# Patient Record
Sex: Male | Born: 1965 | Race: White | Hispanic: No
Health system: Southern US, Community
[De-identification: ages and names within clinical notes are randomized; demographics above are authoritative.]

## PROBLEM LIST (undated history)

## (undated) DIAGNOSIS — F4310 Post-traumatic stress disorder, unspecified: Secondary | ICD-10-CM

---

## 2008-07-15 DIAGNOSIS — I2510 Atherosclerotic heart disease of native coronary artery without angina pectoris: Secondary | ICD-10-CM

## 2008-07-15 HISTORY — PX: CORONARY ARTERY BYPASS GRAFT: SHX141

## 2008-07-15 HISTORY — DX: Atherosclerotic heart disease of native coronary artery without angina pectoris: I25.10

## 2019-06-07 ENCOUNTER — Other Ambulatory Visit: Payer: Self-pay

## 2019-06-07 ENCOUNTER — Inpatient Hospital Stay
Admission: EM | Admit: 2019-06-07 | Discharge: 2019-06-09 | DRG: 247 | Disposition: A | Discharge status: Home / Self Care | Payer: No Typology Code available for payment source | Attending: Internal Medicine | Admitting: Internal Medicine

## 2019-06-07 ENCOUNTER — Encounter
Admission: EM | Disposition: A | Discharge status: Home / Self Care | Payer: Self-pay | Source: Home / Self Care | Attending: Internal Medicine

## 2019-06-07 ENCOUNTER — Emergency Department: Payer: No Typology Code available for payment source

## 2019-06-07 ENCOUNTER — Encounter: Payer: Self-pay | Admitting: Cardiology

## 2019-06-07 DIAGNOSIS — Z888 Allergy status to other drugs, medicaments and biological substances status: Secondary | ICD-10-CM

## 2019-06-07 DIAGNOSIS — F4310 Post-traumatic stress disorder, unspecified: Secondary | ICD-10-CM | POA: Diagnosis present

## 2019-06-07 DIAGNOSIS — R079 Chest pain, unspecified: Secondary | ICD-10-CM

## 2019-06-07 DIAGNOSIS — I214 Non-ST elevation (NSTEMI) myocardial infarction: Secondary | ICD-10-CM | POA: Diagnosis present

## 2019-06-07 DIAGNOSIS — R0789 Other chest pain: Secondary | ICD-10-CM | POA: Diagnosis present

## 2019-06-07 DIAGNOSIS — I255 Ischemic cardiomyopathy: Secondary | ICD-10-CM | POA: Diagnosis present

## 2019-06-07 DIAGNOSIS — I959 Hypotension, unspecified: Secondary | ICD-10-CM | POA: Diagnosis not present

## 2019-06-07 DIAGNOSIS — Z951 Presence of aortocoronary bypass graft: Secondary | ICD-10-CM

## 2019-06-07 DIAGNOSIS — Z20828 Contact with and (suspected) exposure to other viral communicable diseases: Secondary | ICD-10-CM | POA: Diagnosis present

## 2019-06-07 DIAGNOSIS — R001 Bradycardia, unspecified: Secondary | ICD-10-CM | POA: Diagnosis not present

## 2019-06-07 DIAGNOSIS — Z955 Presence of coronary angioplasty implant and graft: Secondary | ICD-10-CM

## 2019-06-07 DIAGNOSIS — I2111 ST elevation (STEMI) myocardial infarction involving right coronary artery: Secondary | ICD-10-CM | POA: Diagnosis present

## 2019-06-07 DIAGNOSIS — I25720 Atherosclerosis of autologous artery coronary artery bypass graft(s) with unstable angina pectoris: Secondary | ICD-10-CM | POA: Insufficient documentation

## 2019-06-07 DIAGNOSIS — Z886 Allergy status to analgesic agent status: Secondary | ICD-10-CM | POA: Diagnosis not present

## 2019-06-07 DIAGNOSIS — I2510 Atherosclerotic heart disease of native coronary artery without angina pectoris: Secondary | ICD-10-CM | POA: Diagnosis present

## 2019-06-07 DIAGNOSIS — E785 Hyperlipidemia, unspecified: Secondary | ICD-10-CM | POA: Diagnosis present

## 2019-06-07 DIAGNOSIS — I200 Unstable angina: Secondary | ICD-10-CM

## 2019-06-07 HISTORY — PX: CORONARY/GRAFT ACUTE MI REVASCULARIZATION: CATH118305

## 2019-06-07 HISTORY — PX: LEFT HEART CATH AND CORONARY ANGIOGRAPHY: CATH118249

## 2019-06-07 HISTORY — DX: Post-traumatic stress disorder, unspecified: F43.10

## 2019-06-07 LAB — BASIC METABOLIC PANEL
Anion gap: 14 (ref 5–15)
BUN: 15 mg/dL (ref 6–20)
CO2: 24 mmol/L (ref 22–32)
Calcium: 9.8 mg/dL (ref 8.9–10.3)
Chloride: 99 mmol/L (ref 98–111)
Creatinine, Ser: 1 mg/dL (ref 0.61–1.24)
GFR calc Af Amer: >60 mL/min (ref >60)
GFR calc non Af Amer: >60 mL/min (ref >60)
Glucose, Bld: 153 mg/dL — ABNORMAL HIGH (ref 70–99)
Potassium: 3.6 mmol/L (ref 3.5–5.1)
Sodium: 137 mmol/L (ref 135–145)

## 2019-06-07 LAB — TROPONIN I (HIGH SENSITIVITY)
Troponin I (High Sensitivity): 18 ng/L — ABNORMAL HIGH (ref <18)
Troponin I (High Sensitivity): 402 ng/L (ref <18)
Troponin I (High Sensitivity): 12149 ng/L (ref <18)

## 2019-06-07 LAB — SARS CORONAVIRUS 2 BY RT PCR (HOSPITAL ORDER, PERFORMED IN ~~LOC~~ HOSPITAL LAB): SARS Coronavirus 2: NEGATIVE

## 2019-06-07 LAB — CBC
HCT: 51 % (ref 39.0–52.0)
Hemoglobin: 17.1 g/dL — ABNORMAL HIGH (ref 13.0–17.0)
MCH: 31.4 pg (ref 26.0–34.0)
MCHC: 33.5 g/dL (ref 30.0–36.0)
MCV: 93.6 fL (ref 80.0–100.0)
Platelets: 275 10*3/uL (ref 150–400)
RBC: 5.45 MIL/uL (ref 4.22–5.81)
RDW: 12.7 % (ref 11.5–15.5)
WBC: 13.8 10*3/uL — ABNORMAL HIGH (ref 4.0–10.5)
nRBC: 0 % (ref 0.0–0.2)

## 2019-06-07 LAB — PROTIME-INR
INR: 1 (ref 0.8–1.2)
Prothrombin Time: 13.2 seconds (ref 11.4–15.2)

## 2019-06-07 LAB — MRSA PCR SCREENING: MRSA by PCR: NEGATIVE

## 2019-06-07 LAB — GLUCOSE, CAPILLARY: Glucose-Capillary: 106 mg/dL — ABNORMAL HIGH (ref 70–99)

## 2019-06-07 LAB — POCT ACTIVATED CLOTTING TIME
Activated Clotting Time: 147 seconds
Activated Clotting Time: 307 seconds
Activated Clotting Time: 323 seconds

## 2019-06-07 LAB — APTT: aPTT: 27 seconds (ref 24–36)

## 2019-06-07 SURGERY — CORONARY/GRAFT ACUTE MI REVASCULARIZATION
Anesthesia: Moderate Sedation

## 2019-06-07 MED ORDER — ATROPINE SULFATE 1 MG/10ML IJ SOSY
PREFILLED_SYRINGE | INTRAMUSCULAR | Status: AC
  Filled 2019-06-07: qty 10

## 2019-06-07 MED ORDER — NITROGLYCERIN 0.4 MG SL SUBL
0.4000 mg | SUBLINGUAL_TABLET | SUBLINGUAL | Status: DC | PRN
  Administered 2019-06-07 (×2): 0.4 mg via SUBLINGUAL
  Filled 2019-06-07 (×2): qty 1

## 2019-06-07 MED ORDER — SODIUM CHLORIDE 0.9% FLUSH: 3.0000 mL | INTRAVENOUS | Status: DC | PRN

## 2019-06-07 MED ORDER — HEPARIN SODIUM (PORCINE) 1000 UNIT/ML IJ SOLN
INTRAMUSCULAR | Status: AC
  Filled 2019-06-07: qty 1

## 2019-06-07 MED ORDER — SODIUM CHLORIDE 0.9% FLUSH
3.0000 mL | Freq: Two times a day (BID) | INTRAVENOUS | Status: DC
  Administered 2019-06-08 (×3): 3 mL via INTRAVENOUS

## 2019-06-07 MED ORDER — FENTANYL CITRATE (PF) 100 MCG/2ML IJ SOLN
INTRAMUSCULAR | Status: AC
  Filled 2019-06-07: qty 2

## 2019-06-07 MED ORDER — SODIUM CHLORIDE 0.9% FLUSH: 3.0000 mL | Freq: Once | INTRAVENOUS | Status: DC

## 2019-06-07 MED ORDER — SODIUM CHLORIDE 0.9 % IV SOLN: 250.0000 mL | INTRAVENOUS | Status: DC | PRN

## 2019-06-07 MED ORDER — ATROPINE SULFATE 1 MG/10ML IJ SOSY
PREFILLED_SYRINGE | INTRAMUSCULAR | Status: DC | PRN
  Administered 2019-06-07 (×2): 0.5 mg via INTRAVENOUS

## 2019-06-07 MED ORDER — MORPHINE SULFATE (PF) 4 MG/ML IV SOLN
4.0000 mg | Freq: Once | INTRAVENOUS | Status: AC
  Administered 2019-06-07: 4 mg via INTRAVENOUS
  Filled 2019-06-07: qty 1

## 2019-06-07 MED ORDER — ENOXAPARIN SODIUM 40 MG/0.4ML ~~LOC~~ SOLN: 40.0000 mg | SUBCUTANEOUS | Status: DC

## 2019-06-07 MED ORDER — SODIUM CHLORIDE 0.9 % WEIGHT BASED INFUSION
1.0000 mL/kg/h | INTRAVENOUS | Status: AC
  Administered 2019-06-07 (×2): 1 mL/kg/h via INTRAVENOUS

## 2019-06-07 MED ORDER — HEPARIN (PORCINE) IN NACL 1000-0.9 UT/500ML-% IV SOLN
INTRAVENOUS | Status: AC
  Filled 2019-06-07: qty 1000

## 2019-06-07 MED ORDER — HEPARIN (PORCINE) 25000 UT/250ML-% IV SOLN
1000.0000 [IU]/h | INTRAVENOUS | Status: DC
  Administered 2019-06-07: 1000 [IU]/h via INTRAVENOUS
  Filled 2019-06-07: qty 250

## 2019-06-07 MED ORDER — TIROFIBAN (AGGRASTAT) BOLUS VIA INFUSION
INTRAVENOUS | Status: DC | PRN
  Administered 2019-06-07: 2155 ug via INTRAVENOUS

## 2019-06-07 MED ORDER — MIDAZOLAM HCL 2 MG/2ML IJ SOLN
INTRAMUSCULAR | Status: AC
  Filled 2019-06-07: qty 2

## 2019-06-07 MED ORDER — TIROFIBAN HCL IV 12.5 MG/250 ML
INTRAVENOUS | Status: AC | PRN
  Administered 2019-06-07: 0.15 ug/kg/min via INTRAVENOUS

## 2019-06-07 MED ORDER — CARVEDILOL 3.125 MG PO TABS
3.1250 mg | ORAL_TABLET | Freq: Two times a day (BID) | ORAL | Status: DC
  Administered 2019-06-08 – 2019-06-09 (×3): 3.125 mg via ORAL
  Filled 2019-06-07 (×4): qty 1

## 2019-06-07 MED ORDER — DIAZEPAM 5 MG PO TABS
5.0000 mg | ORAL_TABLET | Freq: Once | ORAL | Status: AC
  Administered 2019-06-07: 5 mg via ORAL
  Filled 2019-06-07: qty 1

## 2019-06-07 MED ORDER — TIROFIBAN HCL IN NACL 5-0.9 MG/100ML-% IV SOLN
0.1500 ug/kg/min | INTRAVENOUS | Status: AC
  Administered 2019-06-07 – 2019-06-08 (×3): 0.15 ug/kg/min via INTRAVENOUS
  Filled 2019-06-07 (×5): qty 100

## 2019-06-07 MED ORDER — TICAGRELOR 90 MG PO TABS
90.0000 mg | ORAL_TABLET | Freq: Two times a day (BID) | ORAL | Status: DC
  Administered 2019-06-07 – 2019-06-08 (×2): 90 mg via ORAL
  Filled 2019-06-07 (×2): qty 1

## 2019-06-07 MED ORDER — MIDAZOLAM HCL 2 MG/2ML IJ SOLN
INTRAMUSCULAR | Status: DC | PRN
  Administered 2019-06-07 (×2): 1 mg via INTRAVENOUS

## 2019-06-07 MED ORDER — SODIUM CHLORIDE 0.9 % IV BOLUS
1000.0000 mL | Freq: Once | INTRAVENOUS | Status: AC
  Administered 2019-06-07: 1000 mL via INTRAVENOUS

## 2019-06-07 MED ORDER — TICAGRELOR 90 MG PO TABS
ORAL_TABLET | ORAL | Status: DC | PRN
  Administered 2019-06-07: 180 mg via ORAL

## 2019-06-07 MED ORDER — MORPHINE SULFATE (PF) 4 MG/ML IV SOLN
4.0000 mg | Freq: Once | INTRAVENOUS | Status: AC
  Administered 2019-06-07: 4 mg via INTRAVENOUS

## 2019-06-07 MED ORDER — SODIUM CHLORIDE 0.9 % IV SOLN
INTRAVENOUS | Status: DC | PRN
  Administered 2019-06-07 (×2): 25 mL/h via INTRAVENOUS

## 2019-06-07 MED ORDER — HEPARIN BOLUS VIA INFUSION
4000.0000 [IU] | Freq: Once | INTRAVENOUS | Status: AC
  Administered 2019-06-07: 4000 [IU] via INTRAVENOUS
  Filled 2019-06-07: qty 4000

## 2019-06-07 MED ORDER — HEPARIN SODIUM (PORCINE) 1000 UNIT/ML IJ SOLN
INTRAMUSCULAR | Status: DC | PRN
  Administered 2019-06-07 (×2): 8000 [IU] via INTRAVENOUS

## 2019-06-07 MED ORDER — MORPHINE SULFATE (PF) 4 MG/ML IV SOLN
INTRAVENOUS | Status: AC
  Filled 2019-06-07: qty 1

## 2019-06-07 MED ORDER — DIAZEPAM 2 MG PO TABS: 2.0000 mg | ORAL_TABLET | Freq: Four times a day (QID) | ORAL | Status: DC | PRN

## 2019-06-07 MED ORDER — ACETAMINOPHEN 325 MG PO TABS: 650.0000 mg | ORAL_TABLET | ORAL | Status: DC | PRN

## 2019-06-07 MED ORDER — IOHEXOL 300 MG/ML  SOLN
INTRAMUSCULAR | Status: DC | PRN
  Administered 2019-06-07: 230 mL

## 2019-06-07 MED ORDER — TICAGRELOR 90 MG PO TABS
ORAL_TABLET | ORAL | Status: AC
  Filled 2019-06-07: qty 2

## 2019-06-07 MED ORDER — FENTANYL CITRATE (PF) 100 MCG/2ML IJ SOLN
INTRAMUSCULAR | Status: DC | PRN
  Administered 2019-06-07: 25 ug via INTRAVENOUS
  Administered 2019-06-07: 50 ug via INTRAVENOUS

## 2019-06-07 MED ORDER — ONDANSETRON HCL 4 MG/2ML IJ SOLN
4.0000 mg | Freq: Four times a day (QID) | INTRAMUSCULAR | Status: DC | PRN
  Administered 2019-06-07: 4 mg via INTRAVENOUS
  Filled 2019-06-07: qty 2

## 2019-06-07 SURGICAL SUPPLY — 26 items
BALLN TREK RX 2.5X20 (BALLOONS) ×2
BALLN ~~LOC~~ TREK RX 3.75X12 (BALLOONS) ×2
BALLOON TREK RX 2.5X20 (BALLOONS) IMPLANT
BALLOON ~~LOC~~ TREK RX 3.75X12 (BALLOONS) IMPLANT
CANNULA 5F STIFF (CANNULA) ×1 IMPLANT
CATH EXTRAC PRONTO 5.5F 138CM (CATHETERS) ×1 IMPLANT
CATH INFINITI 5 FR IM (CATHETERS) ×1 IMPLANT
CATH INFINITI 5FR ANG PIGTAIL (CATHETERS) ×1 IMPLANT
CATH INFINITI 5FR JL4 (CATHETERS) ×1 IMPLANT
CATH INFINITI JR4 5F (CATHETERS) ×1 IMPLANT
CATH LAUNCHER 6FR JR4 (CATHETERS) ×1 IMPLANT
DEVICE CLOSURE MYNXGRIP 6/7F (Vascular Products) ×1 IMPLANT
DEVICE INFLAT 30 PLUS (MISCELLANEOUS) ×1 IMPLANT
GLIDESHEATH SLEND SS 6F .021 (SHEATH) IMPLANT
KIT MANI 3VAL PERCEP (MISCELLANEOUS) ×2 IMPLANT
NDL PERC 18GX7CM (NEEDLE) IMPLANT
NEEDLE PERC 18GX7CM (NEEDLE) ×2 IMPLANT
PACK CARDIAC CATH (CUSTOM PROCEDURE TRAY) ×2 IMPLANT
SHEATH AVANTI 6FR X 11CM (SHEATH) ×1 IMPLANT
STENT RESOLUTE ONYX 3.0X22 (Permanent Stent) ×1 IMPLANT
STENT RESOLUTE ONYX 3.5X30 (Permanent Stent) ×1 IMPLANT
VALVE COPILOT STAT (MISCELLANEOUS) ×1 IMPLANT
WIRE EMERALD 3MM-J .035X260CM (WIRE) ×1 IMPLANT
WIRE GUIDERIGHT .035X150 (WIRE) ×1 IMPLANT
WIRE ROSEN-J .035X260CM (WIRE) IMPLANT
WIRE RUNTHROUGH .014X180CM (WIRE) ×1 IMPLANT

## 2019-06-07 NOTE — ED Notes (Signed)
Cardiology at bedside.

## 2019-06-07 NOTE — ED Notes (Signed)
Pt given urinal.

## 2019-06-07 NOTE — ED Notes (Signed)
Waiting for cath lab to be ready from previous pt.

## 2019-06-07 NOTE — ED Notes (Signed)
[facility]. Wife in room. No needs at this time.

## 2019-06-07 NOTE — ED Notes (Signed)
Pt to cath lab with RN, zoll and MD

## 2019-06-07 NOTE — ED Triage Notes (Signed)
Patient to RM 25 via EMS from home for sudden onset of chest pain.

## 2019-06-07 NOTE — ED Notes (Signed)
Pt c/o worsening chest pain.  EKG was repeated and shown to dr Charna Archer along with update on sx.  New orders received.

## 2019-06-07 NOTE — ED Notes (Signed)
PAPERWORK  FAXED  TO  [facility] 

## 2019-06-07 NOTE — ED Notes (Signed)
defib pads placed on pt. Pt preparing for cath lab

## 2019-06-07 NOTE — Consult Note (Signed)
ANTICOAGULATION CONSULT NOTE - Initial Consult  Pharmacy Consult for Heparin Infusion  Indication: chest pain/ACS  Allergies  Allergen Reactions  . Aspirin Hives  . Plavix [Clopidogrel Bisulfate] Hives    "messes with my liver"    Patient Measurements: Height: 5\' 8"  (172.7 cm) Weight: 190 lb (86.2 kg) IBW/kg (Calculated) : 68.4 Heparin Dosing Weight: 85kg  Vital Signs: Temp: 99.1 F (37.3 C) (11/23 0428) Temp Source: Oral (11/23 0428) BP: 115/76 (11/23 0930) Pulse Rate: 44 (11/23 0930)  Labs: Recent Labs    06/07/19 0538 06/07/19 0750  HGB 17.1*  --   HCT 51.0  --   PLT 275  --   CREATININE 1.00  --   TROPONINIHS 18* 402*    Estimated Creatinine Clearance: 93.3 mL/min (by C-G formula based on SCr of 1 mg/dL).  Medical History: No past medical history on file.  Assessment: Pharmacy consulted for heparin infusion dosing and monitoring in 53 yo male for ACS/STEMI.  Troponin HS: 18>> 402   Goal of Therapy:  Heparin level 0.3-0.7 units/ml Monitor platelets by anticoagulation protocol: Yes   Plan:  Baseline labs ordered Give 4000 units bolus x 1 Start heparin infusion at 1000 units/hr Check anti-Xa level in 6 hours and daily while on heparin Continue to monitor H&H and platelets  Pernell Dupre, PharmD, BCPS Clinical Pharmacist 06/07/2019 9:59 AM

## 2019-06-07 NOTE — ED Provider Notes (Addendum)
[facility] Emergency Department Provider Note  ____________________________________________   I have reviewed the triage vital signs and the nursing notes.   HISTORY  Chief Complaint Chest Pain   History limited by: Not Limited   HPI Bradley Rosales is a 53 y.o. male who presents to the emergency department today because of concern for chest pain. He states that the pain woke him from his sleep. It was located in the center of his chest. He describes it as the sensation of a hammer hitting him in the chest. It was associated with diaphoresis. Says that he thinks it is related to ptsd that was triggered yesterday. He did take some nitroglycerin which helped with his pain slightly. Says he cannot take aspirin or plavix because of allergies. The patient has had similar pain in the past. Does have history of cabbage and stent placement.   No medical records in our EMR  No past medical history on file.  There are no active problems to display for this patient.   Prior to Admission medications   Not on File    Allergies Aspirin and Plavix [clopidogrel bisulfate]  No family history on file.  Social History Social History   Tobacco Use  . Smoking status: Not on file  Substance Use Topics  . Alcohol use: Not on file  . Drug use: Not on file    Review of Systems Constitutional: No fever/chills Eyes: No visual changes. ENT: No sore throat. Cardiovascular: Positive for chest pain. Respiratory: Denies shortness of breath. Gastrointestinal: No abdominal pain.  No nausea, no vomiting.  No diarrhea.   Genitourinary: Negative for dysuria. Musculoskeletal: Negative for back pain. Skin: Negative for rash. Neurological: Negative for headaches, focal weakness or numbness.  ____________________________________________   PHYSICAL EXAM:  VITAL SIGNS: ED Triage Vitals  Enc Vitals Group     BP --      Pulse --      Resp --      Temp --      Temp src --       SpO2 --      Weight 06/07/19 0417 190 lb (86.2 kg)     Height 06/07/19 0417 5\' 8"  (1.727 m)     Head Circumference --      Peak Flow --      Pain Score 06/07/19 0416 6   Constitutional: Alert and oriented.  Eyes: Conjunctivae are normal.  ENT      Head: Normocephalic and atraumatic.      Nose: No congestion/rhinnorhea.      Mouth/Throat: Mucous membranes are moist.      Neck: No stridor. Hematological/Lymphatic/Immunilogical: No cervical lymphadenopathy. Cardiovascular: Normal rate, regular rhythm.  No murmurs, rubs, or gallops.  Respiratory: Normal respiratory effort without tachypnea nor retractions. Breath sounds are clear and equal bilaterally. No wheezes/rales/rhonchi. Gastrointestinal: Soft and non tender. No rebound. No guarding.  Genitourinary: Deferred Musculoskeletal: Normal range of motion in all extremities. No lower extremity edema. Neurologic:  Normal speech and language. No gross focal neurologic deficits are appreciated.  Skin:  Skin is warm, dry and intact. No rash noted. Psychiatric: Mood and affect are normal. Speech and behavior are normal. Patient exhibits appropriate insight and judgment.  ____________________________________________    LABS (pertinent positives/negatives)  Trop hs 18 CBC wbc 13.8, hgb 17.1, plt 275 BMP wnl except glu 153  ____________________________________________   EKG  I, Nance Pear, attending physician, personally viewed and interpreted this EKG  EKG Time: 0419 Rate: 56 Rhythm:  sinus bradycardia Axis: normal Intervals: qtc 385 QRS: narrow ST changes: no st elevation Impression: abnormal ekg  I, Phineas Semen, attending physician, personally viewed and interpreted this EKG  EKG Time: 0514 Rate: 57 Rhythm: sinus bradycardia Axis: normal Intervals: qtc 387 QRS: narrow ST changes: no st elevation Impression: abnormal ekg  ____________________________________________    RADIOLOGY  CXR Streaky  opacities, likely atelectasis  ____________________________________________   PROCEDURES  Procedures  ____________________________________________   INITIAL IMPRESSION / ASSESSMENT AND PLAN / ED COURSE  Pertinent labs & imaging results that were available during my care of the patient were reviewed by me and considered in my medical decision making (see chart for details).   Patient presented to the emergency department today because of concerns for chest pain.  Patient does have history of coronary artery disease with stent placement and CABG.  Patient's EKG did not show any ST elevation.  I did obtain a repeat EKG which continued to not show any ST elevation.  Patient continued to have some pain here in the emergency department.  He did state that he is allergic to aspirin and Plavix.  He stated that he also cannot take any blood thinners.  I do think patient would benefit from admission given slightly elevated troponin and high risk history.  [facility]  Will send paperwork for transfer. ____________________________________________   FINAL CLINICAL IMPRESSION(S) / ED DIAGNOSES  Final diagnoses:  Chest pain     Note: This dictation was prepared with Dragon dictation. Any transcriptional errors that result from this process are unintentional     Phineas Semen, MD 06/07/19 9242    Phineas Semen, MD 06/07/19 [phone]

## 2019-06-07 NOTE — ED Notes (Signed)
Wife has all belongings.

## 2019-06-07 NOTE — Consult Note (Signed)
Cardiology Consultation Note    Patient ID: Bradley Rosales, MRN: [id number], DOB/AGE: [birth year] 53 y.o. Admit date: 06/07/2019   Date of Consult: 06/07/2019 Primary Physician: [facility] [place] Primary Cardiologist: none  Chief Complaint: chest pain Reason for Consultation: chest pain/abnormal ekg   Requesting MD: Dr. Charna Archer  HPI: Bradley Rosales is a 53 y.o. male with history of coronary artery disease per his report status post PCI attempt which was unsuccessful followed by coronary artery bypass grafting at the [facility] in 2010.  Records from the PCI and the coronary artery bypass grafting are currently not available.  Patient has not followed with a cardiologist due to lack of problems with this.  He awoke from sleep with complaints of chest pain this morning.  Pain is waxing and waning.  Presented to the emergency room where his initial serum troponin was 18 followed by a subsequent value of 402.  His EKG has shown intermittent ST depression in the lateral leads.  He is currently on heparin.  He states that Plavix has caused side effects in the past.  He also states that aspirin causes hives is currently hemodynamically stable with pain improving somewhat.  Renal function is normal.  Pain was associated with diaphoresis.  Past Medical History:  Diagnosis Date  . Coronary artery disease 2010   Status post coronary bypass grafting  . PTSD (post-traumatic stress disorder)       Surgical History: History reviewed. No pertinent surgical history.   Home Meds: Prior to Admission medications   Not on File    Inpatient Medications:  . morphine      . sodium chloride flush  3 mL Intravenous Once   . heparin 1,000 Units/hr (06/07/19 1025)    Allergies:  Allergies  Allergen Reactions  . Aspirin Hives  . Plavix [Clopidogrel Bisulfate] Hives    "messes with my liver"    Social History   Socioeconomic History  . Marital status: Soil scientist    Spouse  name: Not on file  . Number of children: Not on file  . Years of education: Not on file  . Highest education level: Not on file  Occupational History  . Not on file  Social Needs  . Financial resource strain: Not on file  . Food insecurity    Worry: Not on file    Inability: Not on file  . Transportation needs    Medical: Not on file    Non-medical: Not on file  Tobacco Use  . Smoking status: Not on file  Substance and Sexual Activity  . Alcohol use: Not on file  . Drug use: Not on file  . Sexual activity: Not on file  Lifestyle  . Physical activity    Days per week: Not on file    Minutes per session: Not on file  . Stress: Not on file  Relationships  . Social Herbalist on phone: Not on file    Gets together: Not on file    Attends religious service: Not on file    Active member of club or organization: Not on file    Attends meetings of clubs or organizations: Not on file    Relationship status: Not on file  . Intimate partner violence    Fear of current or ex partner: Not on file    Emotionally abused: Not on file    Physically abused: Not on file    Forced sexual activity: Not on  file  Other Topics Concern  . Not on file  Social History Narrative  . Not on file     No family history on file.   Review of Systems: A 12-system review of systems was performed and is negative except as noted in the HPI.  Labs: No results for input(s): CKTOTAL, CKMB, TROPONINI in the last 72 hours. Lab Results  Component Value Date   WBC 13.8 (H) 06/07/2019   HGB 17.1 (H) 06/07/2019   HCT 51.0 06/07/2019   MCV 93.6 06/07/2019   PLT 275 06/07/2019    Recent Labs  Lab 06/07/19 0538  NA 137  K 3.6  CL 99  CO2 24  BUN 15  CREATININE 1.00  CALCIUM 9.8  GLUCOSE 153*   No results found for: CHOL, HDL, LDLCALC, TRIG No results found for: DDIMER  Radiology/Studies:  Dg Chest 1 View  Result Date: 06/07/2019 CLINICAL DATA:  Sudden onset chest pain EXAM:  CHEST  1 VIEW COMPARISON:  None. FINDINGS: Postsurgical changes related to prior CABG including intact and aligned sternotomy wires, sternal fixation plates, and multiple surgical clips projecting over the mediastinum. Mild cardiomegaly. Suspect some abundant pericardial fat extending across the left lung base. Streaky opacities in the lung bases. No pneumothorax or effusion. No convincing features of edema. No acute osseous or soft tissue abnormality. IMPRESSION: 1. Postsurgical changes related to prior CABG. 2. Streaky opacities in the lung bases, favored to reflect atelectasis though early consolidation could have a similar appearance. No convincing evidence of edema or pleural effusion. Electronically Signed   By: Kreg Shropshire M.D.   On: 06/07/2019 05:04    Wt Readings from Last 3 Encounters:  06/07/19 86.2 kg    EKG: Normal sinus rhythm with intermittent lateral ST depression  Physical Exam:  Blood pressure 119/80, pulse (!) 59, temperature 99.1 F (37.3 C), temperature source Oral, resp. rate 20, height 5\' 8"  (1.727 m), weight 86.2 kg, SpO2 97 %. Body mass index is 28.89 kg/m. General: Well developed, well nourished, in no acute distress. Head: Normocephalic, atraumatic, sclera non-icteric, no xanthomas, nares are without discharge.  Neck: Negative for carotid bruits. JVD not elevated. Lungs: Clear bilaterally to auscultation without wheezes, rales, or rhonchi. Breathing is unlabored. Heart: RRR with S1 S2. No murmurs, rubs, or gallops appreciated. Abdomen: Soft, non-tender, non-distended with normoactive bowel sounds. No hepatomegaly. No rebound/guarding. No obvious abdominal masses. Msk:  Strength and tone appear normal for age. Extremities: No clubbing or cyanosis. No edema.  Distal pedal pulses are 2+ and equal bilaterally. Neuro: Alert and oriented X 3. No facial asymmetry. No focal deficit. Moves all extremities spontaneously. Psych:  Responds to questions appropriately with a  normal affect.     Assessment and Plan  53 year old male with prior coronary artery disease status post coronary bypass grafting 10 years ago at the [facility], history of PTSD, history of failed PCI prior to his CABG who presents for evaluation of chest pain that awoke him from sleep this morning associated with diaphoresis.  EKG shows waxing and waning ST depressions in the posterior and lateral leads.  Pain is also waxing and waning.  He is currently hemodynamically stable.  Initial troponin was 18 subsequent was 402.  Concern over possible occluded vein graft or native circumflex distribution.  Discussed with STEMI team.  We will proceed with urgent cardiac cath after heparin and Covid testing.  Further recommendations based on the results of this.  Signed, [facility] MD 06/07/2019,  11:19 AM Pager: [phone]

## 2019-06-07 NOTE — ED Notes (Signed)
Pt call bell going off. RN at bedside and pt c/o being both cold and hot. Appears pale and diaphoretic. Pt placed in trendelenburg and dr Charna Archer called to assess pt.  Dr Charna Archer at bedside and 1 L NS ordered.

## 2019-06-07 NOTE — Progress Notes (Signed)
[place] for Aggrastat infusion  Allergies  Allergen Reactions  . Aspirin Hives  . Plavix [Clopidogrel Bisulfate] Hives    "messes with my liver"   Labs: Recent Labs    06/07/19 0538  WBC 13.8*  HGB 17.1*  HCT 51.0  PLT 275  APTT 27  CREATININE 1.00   Estimated Creatinine Clearance: 93.3 mL/min (by C-G formula based on SCr of 1 mg/dL).     Assessment: 53 year old male s/p cardiac cath with DES to right coronary artery. Plan for Aggrastat infusion for 18 hours.   Plan:  Aggrastat 0.15 mcg/kg/min x 18 hours. Infusion will end at around 0900 tomorrow. Will check CBC with morning labs.  Tawnya Crook, PharmD 06/07/2019,2:41 PM

## 2019-06-07 NOTE — ED Notes (Signed)
Per EMS sudden onset of chest pain, hx of CABG.  ntg sl did not help pain.  States has metal in his chest and when it gets cold he gets chest pain.  While being in back of EMS truck and got warm and his chest pain resolved.

## 2019-06-07 NOTE — H&P (Signed)
History and Physical    Bradley Rosales DOB: [birth year] DOA: 06/07/2019  PCP: [facility] [place] (Confirm with patient/family/[facility] records and if not entered, this has to be entered at [facility] point of entry) Patient coming from: Home   I have personally briefly reviewed patient's old medical records in [place]  Chief Complaint: Chest pain x  10 years.  HPI: Bradley Rosales is a 53 y.o. male with medical history significant of CAD s/ p stent in 2010 seen in ed for chest pain and found to have abnormal ekg c/w stemi , pt was having st depression and changes with symptoms as in ekg. Pt was started on heparin drip for elevated troponin of 402. EKG has showed st depression. Pain is x 10 years and it was intermittent and has had difficulty with progressive worsening with activity. Pt has multiple allergies and cannot tolerate antiplatelet therapy because of BRBPR per pt .   A stemi was called and pt was taken to cath labs and received two stents in RCA. ED Course:  Vitals:   06/07/19 1500 06/07/19 1505  BP: 113/81   Pulse: 84 84  Resp: 12 (!) 23  Temp:    SpO2: 95%      Review of Systems: As per HPI otherwise 10 point review of systems negative.    Past Medical History:  Diagnosis Date  . Coronary artery disease 2010   Status post coronary bypass grafting  . PTSD (post-traumatic stress disorder)     Past Surgical History:  Procedure Laterality Date  . CORONARY ARTERY BYPASS GRAFT  2010   At [facility]  . CORONARY/GRAFT ACUTE MI REVASCULARIZATION N/A 06/07/2019   Procedure: Coronary/Graft Acute MI Revascularization;  Surgeon: Wellington Hampshire, MD;  Location: [place];  Service: Cardiovascular;  Laterality: N/A;  . LEFT HEART CATH AND CORONARY ANGIOGRAPHY N/A 06/07/2019   Procedure: LEFT HEART CATH AND CORONARY ANGIOGRAPHY;  Surgeon: Wellington Hampshire, MD;  Location: [place] [facility];  Service: Cardiovascular;  Laterality: N/A;      has no history on file for tobacco, alcohol, and drug.  Allergies  Allergen Reactions  . Aspirin Hives  . Plavix [Clopidogrel Bisulfate] Hives    "messes with my liver"    No family history on file.   Prior to Admission medications   Not on File    Physical Exam: Vitals:   06/07/19 1150 06/07/19 1400 06/07/19 1500 06/07/19 1505  BP:  (!) 119/96 113/81   Pulse:  98 84 84  Resp:  18 12 (!) 23  Temp:  98.8 F (37.1 C)    TempSrc:  Oral    SpO2: 99% 96% 95%   Weight:      Height:        Constitutional: NAD, calm, comfortable Vitals:   06/07/19 1150 06/07/19 1400 06/07/19 1500 06/07/19 1505  BP:  (!) 119/96 113/81   Pulse:  98 84 84  Resp:  18 12 (!) 23  Temp:  98.8 F (37.1 C)    TempSrc:  Oral    SpO2: 99% 96% 95%   Weight:      Height:       Eyes: PERRL, lids and conjunctivae normal ENMT: Mucous membranes are moist. Posterior pharynx clear of any exudate or lesions.Normal dentition.  Neck: normal, supple, no masses, no thyromegaly Respiratory: clear to auscultation bilaterally, no wheezing, no crackles. Normal respiratory effort. No accessory muscle use.  Cardiovascular: Regular rate and  rhythm, no murmurs / rubs / gallops. No extremity edema. 2+ pedal pulses. No carotid bruits.  Abdomen: no tenderness, no masses palpated. No hepatosplenomegaly. Bowel sounds positive.  Musculoskeletal: no clubbing / cyanosis. No joint deformity upper and lower extremities. Good ROM, no contractures. Normal muscle tone.  Skin: no rashes, lesions, ulcers. No induration Neurologic: CN 2-12 grossly intact. Sensation intact, DTR normal. Strength 5/5 in all 4.  Psychiatric: Normal judgment and insight. Alert and oriented x 3. Normal mood.    Labs on Admission: I have personally reviewed following labs and imaging studies  CBC: Recent Labs  Lab 06/07/19 0538  WBC 13.8*  HGB 17.1*  HCT 51.0  MCV 93.6  PLT 275   Basic Metabolic Panel: Recent Labs  Lab 06/07/19 0538   NA 137  K 3.6  CL 99  CO2 24  GLUCOSE 153*  BUN 15  CREATININE 1.00  CALCIUM 9.8   GFR: Estimated Creatinine Clearance: 93.3 mL/min (by C-G formula based on SCr of 1 mg/dL). Liver Function Tests: No results for input(s): AST, ALT, ALKPHOS, BILITOT, PROT, ALBUMIN in the last 168 hours. No results for input(s): LIPASE, AMYLASE in the last 168 hours. No results for input(s): AMMONIA in the last 168 hours. Coagulation Profile: Recent Labs  Lab 06/07/19 0538  INR 1.0   Cardiac Enzymes: No results for input(s): CKTOTAL, CKMB, CKMBINDEX, TROPONINI in the last 168 hours. BNP (last 3 results) No results for input(s): PROBNP in the last 8760 hours. HbA1C: No results for input(s): HGBA1C in the last 72 hours. CBG: No results for input(s): GLUCAP in the last 168 hours. Lipid Profile: No results for input(s): CHOL, HDL, LDLCALC, TRIG, CHOLHDL, LDLDIRECT in the last 72 hours. Thyroid Function Tests: No results for input(s): TSH, T4TOTAL, FREET4, T3FREE, THYROIDAB in the last 72 hours. Anemia Panel: No results for input(s): VITAMINB12, FOLATE, FERRITIN, TIBC, IRON, RETICCTPCT in the last 72 hours. Urine analysis: No results found for: COLORURINE, APPEARANCEUR, LABSPEC, PHURINE, GLUCOSEU, HGBUR, BILIRUBINUR, KETONESUR, PROTEINUR, UROBILINOGEN, NITRITE, LEUKOCYTESUR  Radiological Exams on Admission: Dg Chest 1 View  Result Date: 06/07/2019 CLINICAL DATA:  Sudden onset chest pain EXAM: CHEST  1 VIEW COMPARISON:  None. FINDINGS: Postsurgical changes related to prior CABG including intact and aligned sternotomy wires, sternal fixation plates, and multiple surgical clips projecting over the mediastinum. Mild cardiomegaly. Suspect some abundant pericardial fat extending across the left lung base. Streaky opacities in the lung bases. No pneumothorax or effusion. No convincing features of edema. No acute osseous or soft tissue abnormality. IMPRESSION: 1. Postsurgical changes related to prior CABG.  2. Streaky opacities in the lung bases, favored to reflect atelectasis though early consolidation could have a similar appearance. No convincing evidence of edema or pleural effusion. Electronically Signed   By: Kreg Shropshire M.D.   On: 06/07/2019 05:04     Assessment/Plan Principal Problem:   STEMI involving right coronary artery (HCC) Pt started on heparin drip and taken to cath lab for urgent cath s/p DES x 2 in RCA. Cardiology recommendation, pt to continue his Aggrastat infusion for 18 hours and then ticagrelor.  Pt is currently in ICU. Start diet as tolerated.  DVT prophylaxis: Heparin  Code Status: Full  Family Communication: None at bedside.  Disposition Plan: D/C home in 48-72 hours.  Consults called: Cardiology consult  Admission status: Inpatient.   Gertha Calkin MD [facility] If 7PM-7AM, please contact night-coverage [email or web address] Password [facility]  06/07/2019, 4:04 PM

## 2019-06-07 NOTE — ED Provider Notes (Signed)
-----------------------------------------   7:49 AM on 06/07/2019 -----------------------------------------  Blood pressure 122/82, pulse (!) 59, temperature 99.1 F (37.3 C), temperature source Oral, resp. rate 18, height 5\' 8"  (1.727 m), weight 86.2 kg, SpO2 94 %.  Assuming care from Dr. Archie Balboa.  In short, Bradley Rosales is a 53 y.o. male with a chief complaint of Chest Pain .  Refer to the original H&P for additional details.  The [facility]  Notified by nursing that patient's repeat troponin now is greater than 400.  He complains of some ongoing chest pain rated at a 5-6 out of 10, although improved from prior.  Repeat EKG is improved from his prior, no longer shows subtle ST depressions.  Will start patient on heparin and have recontacted the [facility] for admission.  Patient was given an additional dose of nitroglycerin, however shortly after became lightheaded and diaphoretic, with decrease in his blood pressure.  He was placed in Trendelenburg and given IV fluid bolus, blood pressure is gradually improving, will continue to monitor.  11:30 AM Patient's blood pressure improved, however he complained of recurrent chest pain and repeat EKG showed worsening lateral ST depressions.  Given his labile blood pressure, do not feel nitroglycerin drip would be appropriate at this time.  Case was discussed with Dr. Ubaldo Glassing of cardiology, who has involved Dr. Fletcher Anon of interventional cardiology.  Patient evaluated by cardiology team and determined he was appropriate for emergent catheterization.  [facility] was notified that patient is unstable for transfer at this time and case discussed with hospitalist, Dr. Posey Pronto, who accepts patient for admission.  ED ECG REPORT I, Blake Divine, the attending physician, personally viewed and interpreted this ECG.   Date: 06/07/2019  EKG Time: 9:38  Rate: 55  Rhythm: normal sinus rhythm  Axis: Normal  Intervals:none  ST&T Change: T wave flattening inferiorly with Q waves  ED ECG REPORT I, Blake Divine, the attending physician, personally viewed and interpreted this ECG.   Date: 06/07/2019  EKG Time: 10:36  Rate: 76  Rhythm: normal sinus rhythm  Axis: Normal  Intervals:none  ST&T Change: Inferior Q waves, lateral ST depressions   CRITICAL CARE Performed by: Blake Divine   Total critical care time: 45 minutes  Critical care time was exclusive of separately billable procedures and treating other patients.  Critical care was necessary to treat or prevent imminent or life-threatening deterioration.  Critical care provided for NSTEMI.  Critical care was time spent personally by me on the following activities: development of treatment plan with patient and/or surrogate as well as nursing, discussions with consultants, evaluation of patient's response to treatment, examination of patient, obtaining history from patient or surrogate, ordering and performing treatments and interventions, ordering and review of laboratory studies, ordering and review of radiographic studies, pulse oximetry and re-evaluation of patient's condition.      Blake Divine, MD 06/07/19 1131

## 2019-06-08 ENCOUNTER — Inpatient Hospital Stay
Admit: 2019-06-08 | Discharge: 2019-06-08 | Disposition: A | Discharge status: Home / Self Care | Payer: No Typology Code available for payment source | Attending: Cardiovascular Disease | Admitting: Cardiovascular Disease

## 2019-06-08 DIAGNOSIS — F4310 Post-traumatic stress disorder, unspecified: Secondary | ICD-10-CM

## 2019-06-08 DIAGNOSIS — I214 Non-ST elevation (NSTEMI) myocardial infarction: Principal | ICD-10-CM

## 2019-06-08 DIAGNOSIS — E785 Hyperlipidemia, unspecified: Secondary | ICD-10-CM

## 2019-06-08 DIAGNOSIS — Z951 Presence of aortocoronary bypass graft: Secondary | ICD-10-CM

## 2019-06-08 LAB — HIV ANTIBODY (ROUTINE TESTING W REFLEX): HIV Screen 4th Generation wRfx: NONREACTIVE

## 2019-06-08 LAB — BASIC METABOLIC PANEL
Anion gap: 7 (ref 5–15)
BUN: 9 mg/dL (ref 6–20)
CO2: 23 mmol/L (ref 22–32)
Calcium: 8.4 mg/dL — ABNORMAL LOW (ref 8.9–10.3)
Chloride: 111 mmol/L (ref 98–111)
Creatinine, Ser: 0.91 mg/dL (ref 0.61–1.24)
GFR calc Af Amer: >60 mL/min (ref >60)
GFR calc non Af Amer: >60 mL/min (ref >60)
Glucose, Bld: 105 mg/dL — ABNORMAL HIGH (ref 70–99)
Potassium: 3.6 mmol/L (ref 3.5–5.1)
Sodium: 141 mmol/L (ref 135–145)

## 2019-06-08 LAB — CBC
HCT: 43.7 % (ref 39.0–52.0)
Hemoglobin: 14.6 g/dL (ref 13.0–17.0)
MCH: 31 pg (ref 26.0–34.0)
MCHC: 33.4 g/dL (ref 30.0–36.0)
MCV: 92.8 fL (ref 80.0–100.0)
Platelets: 246 10*3/uL (ref 150–400)
RBC: 4.71 MIL/uL (ref 4.22–5.81)
RDW: 12.8 % (ref 11.5–15.5)
WBC: 10.1 10*3/uL (ref 4.0–10.5)
nRBC: 0 % (ref 0.0–0.2)

## 2019-06-08 LAB — ECHOCARDIOGRAM COMPLETE
Height: 68 in
Weight: 2578.5 oz

## 2019-06-08 MED ORDER — SODIUM CHLORIDE 0.9 % IV BOLUS
500.0000 mL | Freq: Once | INTRAVENOUS | Status: AC
  Administered 2019-06-08: 500 mL via INTRAVENOUS

## 2019-06-08 MED ORDER — EZETIMIBE 10 MG PO TABS
10.0000 mg | ORAL_TABLET | Freq: Every day | ORAL | Status: DC
  Administered 2019-06-08 – 2019-06-09 (×2): 10 mg via ORAL
  Filled 2019-06-08 (×2): qty 1

## 2019-06-08 MED ORDER — ENOXAPARIN SODIUM 40 MG/0.4ML ~~LOC~~ SOLN
40.0000 mg | SUBCUTANEOUS | Status: DC
  Administered 2019-06-08: 40 mg via SUBCUTANEOUS
  Filled 2019-06-08 (×2): qty 0.4

## 2019-06-08 MED ORDER — CHLORHEXIDINE GLUCONATE CLOTH 2 % EX PADS
6.0000 | MEDICATED_PAD | Freq: Every day | CUTANEOUS | Status: DC
  Administered 2019-06-08: 6 via TOPICAL

## 2019-06-08 NOTE — Progress Notes (Signed)
[facility] [facility] [place] NAME: Bradley Rosales    MR#:  [id number]  DATE OF BIRTH:  03-18-1966  SUBJECTIVE:  patient came in with significant chest pain. He was found to have non-QA of MI underwent urgent cardiac cath.  Currently out in the chair. No chest pain. No shortness of breath.  REVIEW OF SYSTEMS:   Review of Systems  Constitutional: Negative for chills, fever and weight loss.  HENT: Negative for ear discharge, ear pain and nosebleeds.   Eyes: Negative for blurred vision, pain and discharge.  Respiratory: Negative for sputum production, shortness of breath, wheezing and stridor.   Cardiovascular: Negative for palpitations, orthopnea and PND.  Gastrointestinal: Negative for abdominal pain, diarrhea, nausea and vomiting.  Genitourinary: Negative for frequency and urgency.  Musculoskeletal: Positive for joint pain. Negative for back pain.  Neurological: Positive for weakness. Negative for sensory change, speech change and focal weakness.  Psychiatric/Behavioral: Negative for depression and hallucinations. The patient is not nervous/anxious.    Tolerating Diet:yes Tolerating PT: ambulatory  DRUG ALLERGIES:   Allergies  Allergen Reactions  . Aspirin Hives  . Plavix [Clopidogrel Bisulfate] Hives    "messes with my liver"    VITALS:  Blood pressure 112/73, pulse (!) 57, temperature 98.3 F (36.8 C), resp. rate 20, height 5\' 8"  (1.727 m), weight 73.1 kg, SpO2 100 %.  PHYSICAL EXAMINATION:   Physical Exam  GENERAL:  53 y.o.-year-old patient lying in the bed with no acute distress.  EYES: Pupils equal, round, reactive to light and accommodation. No scleral icterus. Extraocular muscles intact.  HEENT: Head atraumatic, normocephalic. Oropharynx and nasopharynx clear.  NECK:  Supple, no jugular venous distention. No thyroid enlargement, no tenderness.  LUNGS: Normal breath sounds bilaterally, no wheezing, rales, rhonchi. No use of  accessory muscles of respiration.  CARDIOVASCULAR: S1, S2 normal. No murmurs, rubs, or gallops. CABG scar + ABDOMEN: Soft, nontender, nondistended. Bowel sounds present. No organomegaly or mass.  EXTREMITIES: No cyanosis, clubbing or edema b/l.    NEUROLOGIC: Cranial nerves II through XII are intact. No focal Motor or sensory deficits b/l.   PSYCHIATRIC:  patient is alert and oriented x 3.  SKIN: No obvious rash, lesion, or ulcer.   LABORATORY PANEL:  CBC Recent Labs  Lab 06/08/19 0346  WBC 10.1  HGB 14.6  HCT 43.7  PLT 246    Chemistries  Recent Labs  Lab 06/08/19 0346  NA 141  K 3.6  CL 111  CO2 23  GLUCOSE 105*  BUN 9  CREATININE 0.91  CALCIUM 8.4*   Cardiac Enzymes No results for input(s): TROPONINI in the last 168 hours. RADIOLOGY:  Dg Chest 1 View  Result Date: 06/07/2019 CLINICAL DATA:  Sudden onset chest pain EXAM: CHEST  1 VIEW COMPARISON:  None. FINDINGS: Postsurgical changes related to prior CABG including intact and aligned sternotomy wires, sternal fixation plates, and multiple surgical clips projecting over the mediastinum. Mild cardiomegaly. Suspect some abundant pericardial fat extending across the left lung base. Streaky opacities in the lung bases. No pneumothorax or effusion. No convincing features of edema. No acute osseous or soft tissue abnormality. IMPRESSION: 1. Postsurgical changes related to prior CABG. 2. Streaky opacities in the lung bases, favored to reflect atelectasis though early consolidation could have a similar appearance. No convincing evidence of edema or pleural effusion. Electronically Signed   By: Lovena Le M.D.   On: 06/07/2019 05:04   ASSESSMENT AND PLAN:   53 year old male with  history of PTSD< coronary artery disease status post coronary artery bypass grafting in 2010 with a LIMA to the LAD and a saphenous vein graft to a diagonal, who presented with chest pain.  1.Acute NSTEMI -patient came in with chest pain and had  transient STT changes with depression and elevation and info lateral leads with pain.  -Patient was taken to the Cath Lab emergently for evaluation. He was noted to have occluded RCA with interested stenosis and previously placed stents. -Underwent PCI with placement of two drug-eluting stents in the RCA -EF 45 to 50% -pt is intolerant of aspirin. -He is on brilinta 90 mg bid. He was on Aggrastat after cath -continue Zetia and Coreg.  2. 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  >50% time spent on counselling and coordination of care  POSSIBLE D/C IN *1* DAYS, DEPENDING ON CLINICAL CONDITION.  Note: This dictation was prepared with Dragon dictation along with smaller phrase technology. Any transcriptional errors that result from this process are unintentional.  Enedina Finner M.D on 06/08/2019 at 2:36 PM  Between 7am to 6pm - Pager - [phone]  After 6pm go to [email or web address]  [facility]   CC: Primary care physician; [facility] ID: Ison Wichmann, male   DOB: 1965-10-04, 53 y.o.   MRN: [id number]

## 2019-06-08 NOTE — Progress Notes (Signed)
Transferred to room 240 via W/C. Report called to [facility] on 2 A.

## 2019-06-08 NOTE — Progress Notes (Signed)
*  PRELIMINARY RESULTS* Echocardiogram 2D Echocardiogram has been performed.  Bradley Rosales 06/08/2019, 8:07 AM

## 2019-06-08 NOTE — Progress Notes (Signed)
Patient Name: Bradley Rosales Date of Encounter: 06/08/2019  Hospital Problem List     Active Problems:   Non-ST elevation myocardial infarction (NSTEMI) [facility])    Patient Profile     53 year old male with history of coronary artery disease status post coronary artery bypass grafting in 2010 with a LIMA to the LAD and a saphenous vein graft to a diagonal, who presented with chest pain.  Transient ST T wave changes with transient ST elevation followed by transient ST depression in the inferolateral leads with pain.  Emergently taken to the Cath Lab for evaluation.  Noted to have an occluded RCA with intrastent stenosis and previously placed stents.  Underwent PCI with placement of a 3.5 x 30 mm resolute Onyx stent followed by a 3.0 x 22 resolute Onyx drug-eluting stent in the RCA.  EF 45 to 50%  Subjective   Doing well this morning.  Relative bradycardia and hypotension.  Currently hemodynamically stable and asymptomatic.  Inpatient Medications    . carvedilol  3.125 mg Oral BID WC  . Chlorhexidine Gluconate Cloth  6 each Topical Daily  . enoxaparin (LOVENOX) injection  40 mg Subcutaneous Q24H  . sodium chloride flush  3 mL Intravenous Once  . sodium chloride flush  3 mL Intravenous Q12H  . ticagrelor  90 mg Oral BID    Vital Signs    Vitals:   06/08/19 0300 06/08/19 0400 06/08/19 0439 06/08/19 0500  BP: (!) 84/46 95/64  99/70  Pulse: (!) 52 (!) 58  (!) 57  Resp: (!) 22 17  (!) 24  Temp:  99.1 F (37.3 C)    TempSrc:      SpO2: 95% 95%  96%  Weight:   73.1 kg   Height:        Intake/Output Summary (Last 24 hours) at 06/08/2019 0726 Last data filed at 06/08/2019 0500 Gross per 24 hour  Intake 1037.19 ml  Output 1300 ml  Net -262.81 ml   Filed Weights   06/07/19 0417 06/08/19 0439  Weight: 86.2 kg 73.1 kg    Physical Exam    GEN: Well nourished, well developed, in no acute distress.  HEENT: normal.  Neck: Supple, no JVD, carotid bruits, or masses. Cardiac:  RRR, no murmurs, rubs, or gallops. No clubbing, cyanosis, edema.  Radials/DP/PT 2+ and equal bilaterally.  Respiratory:  Respirations regular and unlabored, clear to auscultation bilaterally. GI: Soft, nontender, nondistended, BS + x 4. MS: no deformity or atrophy. Skin: warm and dry, no rash. Neuro:  Strength and sensation are intact. Psych: Normal affect.  Labs    CBC Recent Labs    06/07/19 0538 06/08/19 0346  WBC 13.8* 10.1  HGB 17.1* 14.6  HCT 51.0 43.7  MCV 93.6 92.8  PLT 275 246   Basic Metabolic Panel Recent Labs    08/67/61 0538 06/08/19 0346  NA 137 141  K 3.6 3.6  CL 99 111  CO2 24 23  GLUCOSE 153* 105*  BUN 15 9  CREATININE 1.00 0.91  CALCIUM 9.8 8.4*   Liver Function Tests No results for input(s): AST, ALT, ALKPHOS, BILITOT, PROT, ALBUMIN in the last 72 hours. No results for input(s): LIPASE, AMYLASE in the last 72 hours. Cardiac Enzymes No results for input(s): CKTOTAL, CKMB, CKMBINDEX, TROPONINI in the last 72 hours. BNP No results for input(s): BNP in the last 72 hours. D-Dimer No results for input(s): DDIMER in the last 72 hours. Hemoglobin A1C No results for input(s): HGBA1C in the last  72 hours. Fasting Lipid Panel No results for input(s): CHOL, HDL, LDLCALC, TRIG, CHOLHDL, LDLDIRECT in the last 72 hours. Thyroid Function Tests No results for input(s): TSH, T4TOTAL, T3FREE, THYROIDAB in the last 72 hours.  Invalid input(s): FREET3  Telemetry    Sinus bradycardia  ECG    Sinus rhythm with inferolateral ST-T wave changes consistent with ongoing ischemia  Radiology    Dg Chest 1 View  Result Date: 06/07/2019 CLINICAL DATA:  Sudden onset chest pain EXAM: CHEST  1 VIEW COMPARISON:  None. FINDINGS: Postsurgical changes related to prior CABG including intact and aligned sternotomy wires, sternal fixation plates, and multiple surgical clips projecting over the mediastinum. Mild cardiomegaly. Suspect some abundant pericardial fat extending  across the left lung base. Streaky opacities in the lung bases. No pneumothorax or effusion. No convincing features of edema. No acute osseous or soft tissue abnormality. IMPRESSION: 1. Postsurgical changes related to prior CABG. 2. Streaky opacities in the lung bases, favored to reflect atelectasis though early consolidation could have a similar appearance. No convincing evidence of edema or pleural effusion. Electronically Signed   By: Lovena Le M.D.   On: 06/07/2019 05:04    Assessment & Plan    53 year old male with history of coronary disease status post coronary bypass grafting in 2010 with a LIMA to the LAD and a vein graft to the diagonal.  Status post PCI x2 of the RCA.  Patient had previous stents in the RCA.  Underwent placement of a 3.5 x 30 followed by 3.0 x 22 mm drug-eluting resolute Onyx stents in the RCA.  EF 45 to 50%.  Coronary artery disease-patient had transient ST elevation initially followed by ST depression.  Diagnosed as a non-ST elevation myocardial infarction.  Status post PCI x2 in the native RCA.  Was on Aggrastat overnight.  Cath site appears stable.  Distal pulses intact.  Patient is allergic to aspirin and not responsive to clopidogrel.  Was placed on ticagrelor 90 mg twice daily as monotherapy.  Will discontinue Aggrastat after 18 hours which will stop at 08 100 this morning.  Currently on low-dose carvedilol which we will continue following his pulse and blood pressure.  Will review echocardiogram when available for better assessment of his LV function and valvular disease.  Patient states he has an intolerance to all statins.  Will place on Zetia and if not tolerated consider a PCSK9 inhibitor.  Will transfer to telemetry and follow for 24 hours and if stable consider discharge in the morning.  PTSD-currently stable    Signed, Javier Docker. Shemeca Lukasik MD 06/08/2019, 7:26 AM  Pager: [phone]

## 2019-06-09 MED ORDER — CARVEDILOL 3.125 MG PO TABS: 3.1250 mg | ORAL_TABLET | Freq: Two times a day (BID) | ORAL | 0 refills | Status: AC

## 2019-06-09 MED ORDER — EZETIMIBE 10 MG PO TABS: 10.0000 mg | ORAL_TABLET | Freq: Every day | ORAL | 0 refills | Status: AC

## 2019-06-09 MED ORDER — TICAGRELOR 90 MG PO TABS: 90.0000 mg | ORAL_TABLET | Freq: Two times a day (BID) | ORAL | Status: DC

## 2019-06-09 MED ORDER — TICAGRELOR 90 MG PO TABS: 90.0000 mg | ORAL_TABLET | Freq: Two times a day (BID) | ORAL | 1 refills | Status: AC

## 2019-06-09 MED ORDER — NITROGLYCERIN 0.4 MG SL SUBL: 0.4000 mg | SUBLINGUAL_TABLET | SUBLINGUAL | 1 refills | Status: AC | PRN

## 2019-06-09 NOTE — Progress Notes (Signed)
[facility]  SUBJECTIVE: Bradley Rosales is a 53 year old male with a past medical history significant for coronary artery disease s/p CABG at [facility] in 2010 and PTSD who presented to the ED on 06/07/19 for chest pain.  High sensitivity troponin was 18, 402, and 12,149 respectively and urgent cardiac catheterization was performed by Dr. Kirke Corin.  Catheterization revealed severe three-vessel coronary artery disease with patent LIMA to the LAD and patent SVG to diagonal, a small left circumflex, diffusely diseased, an occluded RCA in the proximal stent due to stent thrombosis which was likely the culprit for the MI.  Successful PCI was carried out with balloon angioplasty and 2 drug eluding stents (Resolute Onyx 3.0 x 28mm and 3.5 x 44mm) to the RCA.   Today, Bradley Rosales reports complete resolution in symptoms and denies chest pain, palpitations, shortness of breath, lower extremity swelling, orthopnea, PND, or syncopal/presyncopal episodes.  Denies bleeding or drainage from access site.     Vitals:   06/08/19 1706 06/08/19 1810 06/08/19 1958 06/09/19 0343  BP: 112/73 95/77 111/78 106/70  Pulse: 63 74 69 71  Resp:  16 20 20   Temp:  98.4 F (36.9 C) 99 F (37.2 C) 99.1 F (37.3 C)  TempSrc:  Oral Oral Oral  SpO2:  94% 95% 99%  Weight:    80.2 kg  Height:         Intake/Output Summary (Last 24 hours) at 06/09/2019 0730 Last data filed at 06/08/2019 1300 Gross per 24 hour  Intake 243 ml  Output -  Net 243 ml      PHYSICAL EXAM  General: Well developed, well nourished, in no acute distress HEENT:  Normocephalic and atramatic Neck:  No JVD.  Lungs: Clear bilaterally to auscultation and percussion. Heart: HRRR . Normal S1 and S2 without gallops or murmurs.  Abdomen: Bowel sounds are positive, abdomen soft and non-tender  Msk:  Back normal, normal gait. Normal strength and tone for age. Extremities: No clubbing, cyanosis or edema.  Right groin access site clean, dry, intact.  No evidence  of ecchymosis, edema, erythema.   Neuro: Alert and oriented X 3. Psych:  Good affect, responds appropriately   LABS: Basic Metabolic Panel: Recent Labs    06/07/19 0538 06/08/19 0346  NA 137 141  K 3.6 3.6  CL 99 111  CO2 24 23  GLUCOSE 153* 105*  BUN 15 9  CREATININE 1.00 0.91  CALCIUM 9.8 8.4*   Liver Function Tests: No results for input(s): AST, ALT, ALKPHOS, BILITOT, PROT, ALBUMIN in the last 72 hours. No results for input(s): LIPASE, AMYLASE in the last 72 hours. CBC: Recent Labs    06/07/19 0538 06/08/19 0346  WBC 13.8* 10.1  HGB 17.1* 14.6  HCT 51.0 43.7  MCV 93.6 92.8  PLT 275 246   Cardiac Enzymes: No results for input(s): CKTOTAL, CKMB, CKMBINDEX, TROPONINI in the last 72 hours. BNP: Invalid input(s): POCBNP D-Dimer: No results for input(s): DDIMER in the last 72 hours. Hemoglobin A1C: No results for input(s): HGBA1C in the last 72 hours. Fasting Lipid Panel: No results for input(s): CHOL, HDL, LDLCALC, TRIG, CHOLHDL, LDLDIRECT in the last 72 hours. Thyroid Function Tests: No results for input(s): TSH, T4TOTAL, T3FREE, THYROIDAB in the last 72 hours.  Invalid input(s): FREET3 Anemia Panel: No results for input(s): VITAMINB12, FOLATE, FERRITIN, TIBC, IRON, RETICCTPCT in the last 72 hours.  No results found.   Echo:  Low normal LV systolic function with an EF estimated between 50-55%  with mild LVH; no evidence of significant valvular abnormalities.    TELEMETRY: Normal sinus rhythm, ventricular rate 70bpm   ASSESSMENT AND PLAN:  Active Problems:   Non-ST elevation myocardial infarction (NSTEMI) (HCC)   S/P CABG (coronary artery bypass graft)   Hyperlipidemia    1.  NSTEMI   -Patient intolerant to aspirin, Plavix; will discharge on Brilinta 90mg  BID  -Unable to tolerate statin therapy; will continue with Zetia 10mg  daily   -Will continue with low dose carvedilol at 3.125mg  twice daily with further medication adjustments in an outpatient  setting   -Recommend cardiac rehab   -Okay to discharge from a cardiac standpoint with follow up at [facility] clinic within 7-10 days with Dr. Bartholome Bill or Doristine Mango   The history, physical exam findings, and plan of care were all discussed with Dr. Bartholome Bill, and all decision mak  Bradley Arenas  PA-C  06/09/2019 7:30 AM

## 2019-06-09 NOTE — Discharge Summary (Signed)
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 Coronary artery disease 2010   Status post coronary bypass grafting  . PTSD (post-traumatic stress disorder)     HOSPITAL COURSE:   53 year old male with history of PTSD< coronary artery disease status post coronary artery bypass grafting in 2010 with a LIMA to the LAD and a saphenous vein graft to a diagonal, who presented with chest pain.  1.Acute NSTEMI -patient came in with chest pain and had transient STT changes with depression and elevation and info lateral leads with pain.  -Patient was taken to the Cath Lab emergently for evaluation. He was noted to have occluded RCA with instent stenosis and previously placed stents. -Underwent PCI with placement of two drug-eluting stents in the RCA -EF 45 to 50% -pt is intolerant of aspirin. -He is on brilinta 90 mg bid. He was on Aggrastat after cath -continue Zetia and Coreg. -BP soft for ace--f/u cardiology Dr Ubaldo Glassing. Pt has [place] benefits--recommended him to [facility]  2. Cardiomyopathy due to coronary artery disease -EF 45% by cardiac cath -cont coreg -consider ACE if BP allows--defer to Cardiology as out pt  3.[facility]  4.DVT prophylaxis lovenox from 11/25  Overall stable. CP free. Ok from Cardiology standpoint for d/c Pt  agreeable CSW to see if can get help with meds.  CONSULTS OBTAINED:  Treatment Team:  Teodoro Spray, MD  DRUG ALLERGIES:   Allergies  Allergen Reactions  . Aspirin Hives  . Gluten Meal   . Plavix [Clopidogrel Bisulfate] Hives    "messes with my liver"  . Shellfish Allergy   . Statins   . Tramadol Hives    DISCHARGE MEDICATIONS:   Allergies as of 06/09/2019      Reactions   Aspirin Hives   Gluten Meal    Plavix [clopidogrel Bisulfate] Hives   "messes with my liver"   Shellfish Allergy    Statins    Tramadol Hives      Medication List    TAKE these medications   carvedilol 3.125 MG tablet Commonly known as: COREG Take 1 tablet (3.125 mg total) by mouth 2 (two) times daily with a meal.   ezetimibe 10 MG tablet Commonly known as: ZETIA Take 1 tablet (10 mg total) by mouth daily. Start taking on: June 10, 2019   nitroGLYCERIN 0.4 MG SL tablet Commonly known as: NITROSTAT Place 1 tablet (0.4 mg total) under the tongue every 5 (five) minutes as needed for chest pain.   ticagrelor 90 MG Tabs tablet Commonly known as: BRILINTA Take 1 tablet (90 mg total) by mouth 2 (two) times daily.       If you experience worsening of your admission symptoms, develop shortness of breath, life threatening emergency, suicidal or homicidal thoughts you must seek medical attention immediately by calling 911 or calling your MD immediately  if symptoms less severe.  You  Must read complete instructions/literature along with all the possible adverse reactions/side effects for all the Medicines you take and that have been prescribed to you. Take any new Medicines after you have completely understood and accept all the possible adverse reactions/side effects.   Please note  You were cared for by a hospitalist during your hospital stay. If you have any questions about your discharge medications or the care you received while you were in the hospital after you are discharged, you can  call the unit and asked to speak with the hospitalist on call if the hospitalist that took care of you is not available. Once you are discharged, your primary care physician will handle any further medical issues. Please note that NO REFILLS for any discharge medications will be authorized once you are discharged, as it is imperative that you return to your primary care physician (or establish a relationship with a primary care physician if you do not have one) for your aftercare needs so that they can reassess your need for medications and monitor your lab values. Today   SUBJECTIVE   No cp. Ambulating without any difficulty  VITAL SIGNS:  Blood pressure 101/75, pulse 63, temperature 98.2 F (36.8 C), temperature source Oral, resp. rate 18, height 5\' 8"  (1.727 m), weight 80.2 kg, SpO2 97 %.  I/O:    Intake/Output Summary (Last 24 hours) at 06/09/2019 0936 Last data filed at 06/08/2019 1300 Gross per 24 hour  Intake 243 ml  Output -  Net 243 ml    PHYSICAL EXAMINATION:  GENERAL:  53 y.o.-year-old patient lying in the bed with no acute distress.  EYES: Pupils equal, round, reactive to light and accommodation. No scleral icterus. Extraocular muscles intact.  HEENT: Head atraumatic, normocephalic. Oropharynx and nasopharynx clear.  NECK:  Supple, no jugular venous distention. No thyroid enlargement, no tenderness.  LUNGS: Normal breath sounds bilaterally, no wheezing, rales,rhonchi or crepitation. No use of accessory muscles of respiration.  CARDIOVASCULAR: S1, S2 normal. No murmurs, rubs, or gallops.  ABDOMEN: Soft, non-tender, non-distended. Bowel sounds present. No organomegaly or mass.  EXTREMITIES: No pedal edema, cyanosis, or clubbing.  NEUROLOGIC: Cranial nerves II through XII are intact. Muscle strength 5/5 in all extremities. Sensation intact. Gait not checked.  PSYCHIATRIC: The patient is alert and oriented x 3.  SKIN: No obvious rash, lesion, or ulcer.   DATA REVIEW:    CBC  Recent Labs  Lab 06/08/19 0346  WBC 10.1  HGB 14.6  HCT 43.7  PLT 246    Chemistries  Recent Labs  Lab 06/08/19 0346  NA 141  K 3.6  CL 111  CO2 23  GLUCOSE 105*  BUN 9  CREATININE 0.91  CALCIUM 8.4*    Microbiology Results   Recent Results (from the past 240 hour(s))  SARS Coronavirus 2 by RT PCR (hospital order, performed in [facility] hospital lab) Nasopharyngeal Nasopharyngeal Swab     Status: None   Collection Time: 06/07/19 11:05 AM   Specimen: Nasopharyngeal Swab  Result Value Ref Range Status   SARS Coronavirus 2 NEGATIVE NEGATIVE Final    Comment: (NOTE) If result is NEGATIVE SARS-CoV-2 target nucleic acids are NOT DETECTED. The SARS-CoV-2 RNA is generally detectable in upper and lower  respiratory specimens during the acute phase of infection. The lowest  concentration of SARS-CoV-2 viral copies this assay can detect is 250  copies / mL. A negative result does not preclude SARS-CoV-2 infection  and should not be used as the sole basis  for treatment or other  patient management decisions.  A negative result may occur with  improper specimen collection / handling, submission of specimen other  than nasopharyngeal swab, presence of viral mutation(s) within the  areas targeted by this assay, and inadequate number of viral copies  (<250 copies / mL). A negative result must be combined with clinical  observations, patient history, and epidemiological information. If result is POSITIVE SARS-CoV-2 target nucleic acids are DETECTED. The SARS-CoV-2 RNA is generally detectable in upper and lower  respiratory specimens dur ing the acute phase of infection.  Positive  results are indicative of active infection with SARS-CoV-2.  Clinical  correlation with patient history and other diagnostic information is  necessary to determine patient infection status.  Positive results do  not rule out bacterial infection or co-infection with other viruses. If result  is PRESUMPTIVE POSTIVE SARS-CoV-2 nucleic acids MAY BE PRESENT.   A presumptive positive result was obtained on the submitted specimen  and confirmed on repeat testing.  While 2019 novel coronavirus  (SARS-CoV-2) nucleic acids may be present in the submitted sample  additional confirmatory testing may be necessary for epidemiological  and / or clinical management purposes  to differentiate between  SARS-CoV-2 and other Sarbecovirus currently known to infect humans.  If clinically indicated additional testing with an alternate test  methodology [phone]) is advised. The SARS-CoV-2 RNA is generally  detectable in upper and lower respiratory sp ecimens during the acute  phase of infection. The expected result is Negative. Fact Sheet for Patients:  BoilerBrush.com.cy Fact Sheet for Healthcare Providers: https://pope.com/ This test is not yet approved or cleared by the [place] FDA and has been authorized for detection and/or diagnosis of SARS-CoV-2 by FDA under an Emergency Use Authorization (EUA).  This EUA will remain in effect (meaning this test can be used) for the duration of the COVID-19 declaration under Section 564(b)(1) of the Act, 21 U.S.C. section 360bbb-3(b)(1), unless the authorization is terminated or revoked sooner. Performed at [facility], [address] [place], [place] [address]   MRSA PCR Screening     Status: None   Collection Time: 06/07/19  2:09 PM   Specimen: Nasopharyngeal  Result Value Ref Range Status   MRSA by PCR NEGATIVE NEGATIVE Final    Comment:        The GeneXpert MRSA Assay (FDA approved for NASAL specimens only), is one component of a comprehensive MRSA colonization surveillance program. It is not intended to diagnose MRSA infection nor to guide or monitor treatment for MRSA infections. Performed at [facility], [address], [place], [place] [address]      RADIOLOGY:  No results found.   CODE STATUS:     Code Status Orders  (From admission, onward)         Start     Ordered   06/07/19 1549  Full code  Continuous     06/07/19 1549        Code Status History    Date Active Date Inactive Code Status Order ID Comments User Context   06/07/2019 1404 06/07/2019 1549 Full Code [id number]  Iran Ouch, MD Inpatient   Advance Care Planning Activity    Advance Directive Documentation     Most Recent Value  Type of Advance Directive  Healthcare Power of Attorney, Living will  Pre-existing out of facility DNR order (yellow form or pink MOST form)  -  "MOST" Form in Place?  -     Family  Discussion: updated patient Consults: Cardiology Dr Lady Gary   TOTAL TIME TAKING CARE OF THIS PATIENT: *40* minutes.    Enedina Finner M.D on 06/09/2019 at 9:36 AM  Between 7am to 6pm - Pager - [phone] After 6pm go to [email or web address] - password [email or web address]  [facility]    CC: Primary care physician; [facility] [employer]

## 2019-06-09 NOTE — Clinical Social Work Note (Addendum)
Faxed Coreg, Nitro, and Zetia to medication management to be filled. Will give patient 30-day free card with Brilinta prescription so he can take it to a pharmacy of his choice.  Dayton Scrape, [place]  10:54 am: CMA will pick up medications and bring them to patient's room. Gave patient Brilinta prescription and 30-day free card. Patient will discharge home today. No further concerns.  CSW signing off.  Dayton Scrape, [place]

## 2019-06-09 NOTE — Progress Notes (Signed)
Discharge instructions given and went over with patient at bedside. All questions answered. Patient discharged home. Railynn Ballo S, RN  

## 2019-06-09 NOTE — Progress Notes (Signed)
Cardiovascular and Pulmonary Nurse Navigator Note:    53 year old male with medical history significant for post-traumatic stress disorder and coronary artery disease s/p CABG in 2010.  Patient presented to the ER with c/o chest pain.  Patient ruled in for NSTEMI.    Procedures Coronary/Graft Acute MI Revascularization  LEFT HEART CATH AND CORONARY ANGIOGRAPHY  Conclusion    Prox Cx to Mid Cx lesion is 99% stenosed.  Prox LAD to Mid LAD lesion is 100% stenosed.  Prox RCA to Mid RCA lesion is 100% stenosed.  Post intervention, there is a 0% residual stenosis.  Mid RCA lesion is 20% stenosed.  Mid RCA to Dist RCA lesion is 90% stenosed.  Post intervention, there is a 0% residual stenosis.  A drug-eluting stent was successfully placed using a STENT RESOLUTE ONYX 3.0X22.  A drug-eluting stent was successfully placed using a STENT RESOLUTE ONYX 3.5X30.  RPDA lesion is 100% stenosed.  LIMA and is normal in caliber.  The graft exhibits no disease.  There is mild left ventricular systolic dysfunction.  LV end diastolic pressure is mildly elevated.  The left ventricular ejection fraction is 45-50% by visual estimate.   1.  Severe underlying three-vessel coronary artery disease with patent LIMA to LAD and patent SVG to diagonal.  The native left circumflex is relatively small and diffusely diseased.  The RCA is occluded in the proximal segment due to very late stent thrombosis superimposed on severe restenosis.  This is the likely culprit for myocardial infarction. 2.  Mildly reduced LV systolic function with inferior wall hypokinesis.  LVEDP was around 22 mmHg. 3.  Successful complex aspiration thrombectomy, balloon angioplasty and 2 drug-eluting stent placement to the right coronary artery.  Recommendations: The patient is allergic to aspirin and not responsive to clopidogrel.  I elected to treat him with ticagrelor monotherapy which should be continued indefinitely if  tolerated. Continue Aggrastat infusion for 18 hours. The patient reports intolerance to all statins and should consider alternatives such as Zetia or PCSK9 inhibitor. I started small dose carvedilol.     Patient Name:   Bradley Rosales Date of Exam: 06/08/2019 Medical Rec #:  [id number]     Height:       68.0 in Accession #:    [id number]    Weight:       161.2 lb Date of Birth:  23-Nov-1965    BSA:          1.86 m Patient Age:    12 years      BP:           94/60 mmHg Patient Gender: M             HR:           54 bpm. Exam Location:  [facility]  Procedure: 2D Echo, Cardiac Doppler and Color Doppler  Indications:     NSTEMI 121.4   History:         Patient has no prior history of Echocardiogram examinations.                  CAD. PTSD.   Sonographer:     Sherrie Sport RDCS (AE) Referring Phys:  [place] Diagnosing Phys: Bartholome Bill MD    Sonographer Comments: Suboptimal apical window. IMPRESSIONS    1. Left ventricular ejection fraction, by visual estimation, is 50 to 55%. The left ventricle has low normal function. Left ventricular septal wall thickness was mildly increased. Mildly increased left  ventricular posterior wall thickness. There is  borderline left ventricular hypertrophy.  2. Small inferior and apical myocardial infarction.  3. Global right ventricle has normal systolic function.The right ventricular size is mildly enlarged. No increase in right ventricular wall thickness.  4. Left atrial size was normal.  5. Right atrial size was normal.  6. The mitral valve is grossly normal. Trace mitral valve regurgitation.  7. The tricuspid valve is grossly normal. Tricuspid valve regurgitation is trivial.  8. The aortic valve is grossly normal. Aortic valve regurgitation is not visualized.  9. The pulmonic valve was not well visualized. Pulmonic valve regurgitation is trivial. 10. Normal pulmonary artery systolic pressure. 11. The atrial septum is grossly normal.    EDUCATION:   "Heart Attack Bouncing Back" booklet given and reviewed with patient. (NOTE:  CAD is not a new diagnosis for this patient. Reviewed the location of CAD and where his stents were placed. Patient has stent card. Instructed patient to keep stent card in his wallet.  Discussed modifiable risk factors including controlling blood pressure, cholesterol, and blood sugar; following heart healthy diet; maintaining healthy weight; exercise; and smoking cessation.    Discussed cardiac medications including rationale for taking, mechanisms of action, and side effects. Stressed the importance of taking medications as prescribed.  Discussed emergency plan for heart attack symptoms. Patient verbalized understanding of need to call 911 and not to drive himself to ER if having cardiac symptoms / chest pain.   Diet -  low sodium, low fat, low cholesterol heart healthy diet discussed.  Patient reports losing 40 pounds over the last year due to following a gluten free diet.    Smoking Cessation - Patient is a CURRENT smoker.  Patient reported to this RN that he does not smoke cigarettes, but smokes pot.  Patient never gave this RN a quantity of how much pot he was smoking per week.    Exercise - Benefits of exercised discussed. This RN explained to patient he has been referred to outpatient Cardiac Rehab at [facility].  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  Patient describes these jobs as his Cardiac Rehab, as he gets a lot of steps and stair climbing in delivering food / items.  In addition, patient follows up with a physical therapist as well as his PCP as the [place] in [place].  Physical therapist looks at patient's app on his phone to see how many steps being walked per day and how many stairs are being climbed.  Patient wants to continue his  current routine.  Patient declined participation in [facility] Cardiac Rehab.  As a result, the referral to Cardiac Rehab has been closed.    Contacted Charlynn Court, LCSW, regarding patient's need for Brilinta coupon and assistance with other discharge medications.    Army Melia, RN, BSN, [facility]  [place]  [facility] Cardiac & Pulmonary Rehab  Cardiovascular & Pulmonary Nurse Navigator  Direct Line: [phone]  Department Phone #: [phone] Fax: [phone]  Email Address: [email or web address]

## 2019-10-06 ENCOUNTER — Telehealth: Payer: Self-pay | Admitting: Pharmacy Technician

## 2019-10-06 NOTE — Telephone Encounter (Signed)
Patient failed to provide 2021 poi.  No additional medication assistance will be provided by [facility] without the required proof of income documentation.  Patient notified by letter.  Kassadee Carawan J. Saprina Chuong Care Manager [facility]   [address] [place], [place]  [address]     This is to inform you that you are no longer eligible to receive medication assistance at [facility].  The reason(s) are:    _____Your total gross monthly household income exceeds 250% of the Federal Poverty Level.   _____Tangible assets (savings, checking, stocks/bonds, pension, retirement, etc.) exceeds our limit  _____You are eligible to receive benefits from Medicaid, [facility] or HIV Medication              Assistance Program _____You are eligible to receive benefits from a Medicare Part "D" plan _____You have prescription insurance  _____You are not an [place] resident __X__Failure to provide all requested proof of income information for 2021.    Medication assistance will resume once all requested financial information has been returned to our clinic.  If you have questions, please contact our clinic at [phone].    Thank you,  [facility] 

## 2020-04-22 IMAGING — DX DG CHEST 1V
1 series · 1 of 1 positions shown · non-contrast
Comparison: None.

CLINICAL DATA: Sudden onset chest pain

EXAM:
CHEST  1 VIEW

[chest ap]
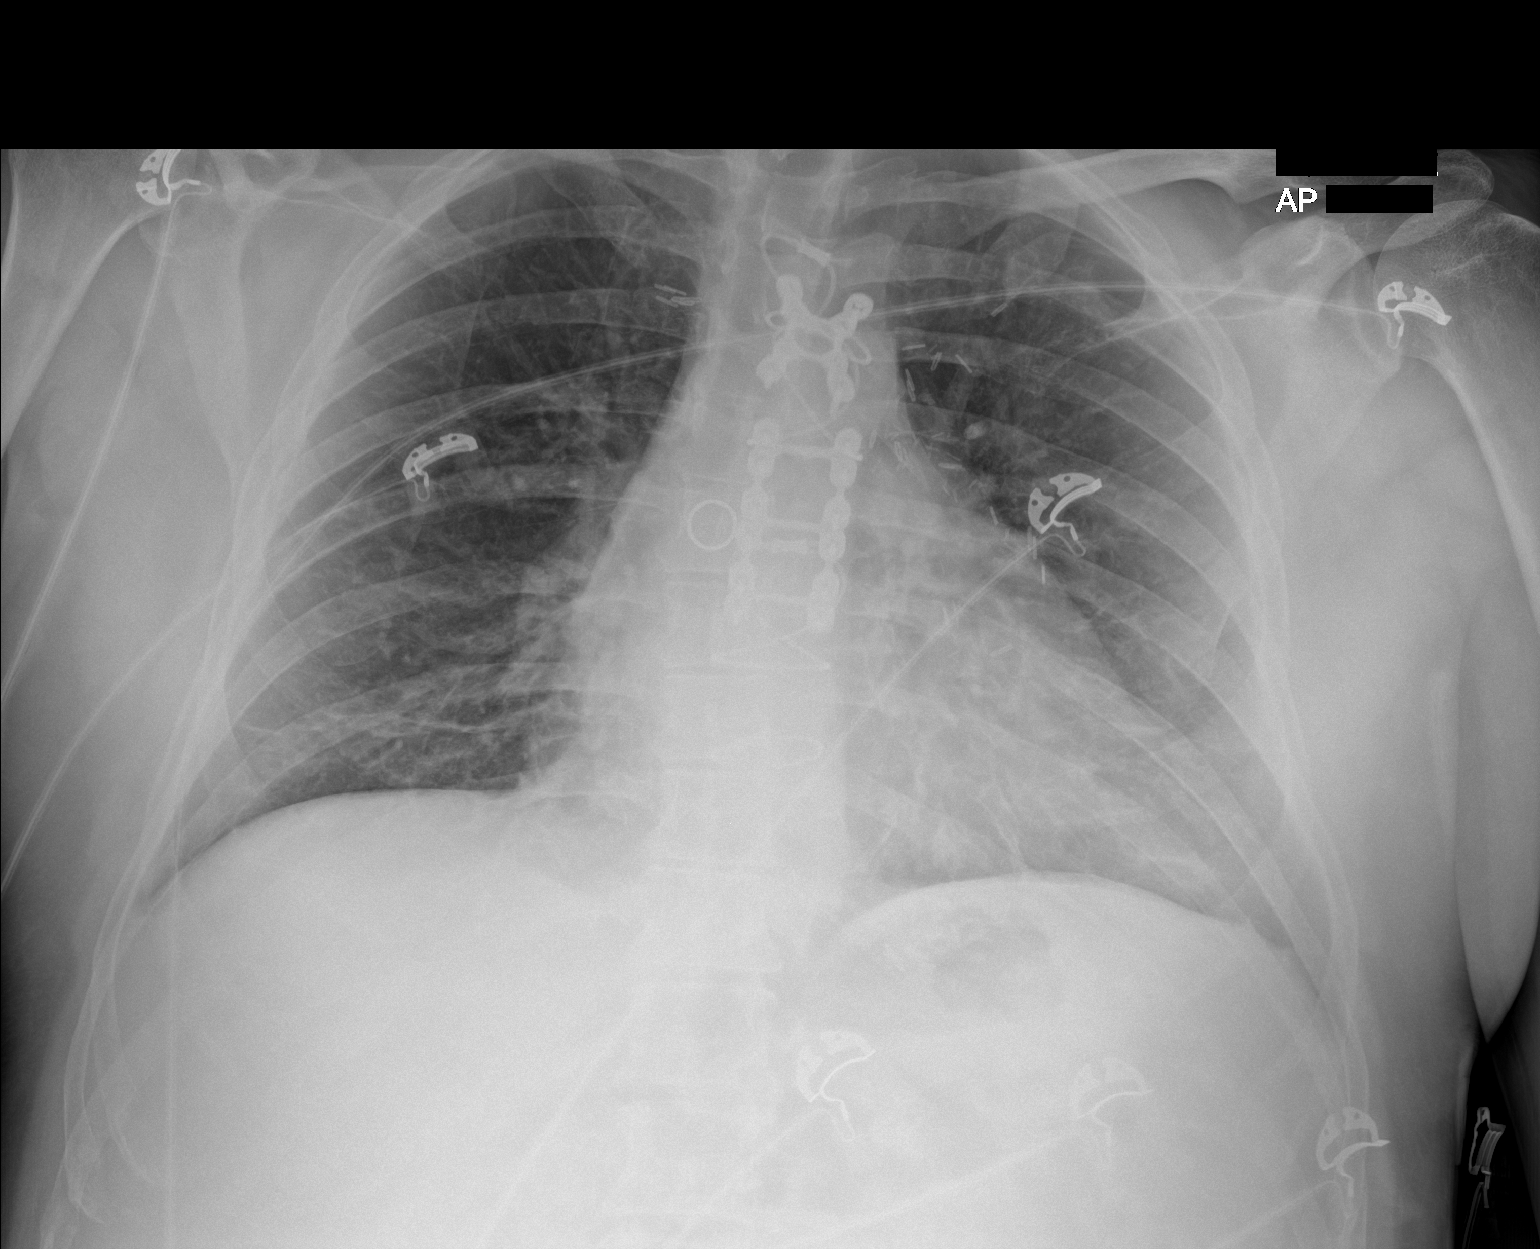

[1 of 1 positions shown; findings below may reference images not displayed]

FINDINGS: Postsurgical changes related to prior CABG including intact and
aligned sternotomy wires, sternal fixation plates, and multiple
surgical clips projecting over the mediastinum. Mild cardiomegaly.
Suspect some abundant pericardial fat extending across the left lung
base. Streaky opacities in the lung bases. No pneumothorax or
effusion. No convincing features of edema. No acute osseous or soft
tissue abnormality.
IMPRESSION: 1. Postsurgical changes related to prior CABG.
2. Streaky opacities in the lung bases, favored to reflect
atelectasis though early consolidation could have a similar
appearance. No convincing evidence of edema or pleural effusion.

## 2023-08-03 ENCOUNTER — Emergency Department: Payer: No Typology Code available for payment source

## 2023-08-03 ENCOUNTER — Other Ambulatory Visit: Payer: Self-pay

## 2023-08-03 DIAGNOSIS — R42 Dizziness and giddiness: Secondary | ICD-10-CM | POA: Insufficient documentation

## 2023-08-03 DIAGNOSIS — Z5321 Procedure and treatment not carried out due to patient leaving prior to being seen by health care provider: Secondary | ICD-10-CM | POA: Insufficient documentation

## 2023-08-03 DIAGNOSIS — R079 Chest pain, unspecified: Secondary | ICD-10-CM | POA: Diagnosis present

## 2023-08-03 DIAGNOSIS — R0602 Shortness of breath: Secondary | ICD-10-CM | POA: Insufficient documentation

## 2023-08-03 NOTE — ED Triage Notes (Addendum)
Pt to ED via EMS from home, pt reports intermittent chest pain that began this afternoon, pt reports shortness of breath and dizziness with the pain. Pt states he has hx of MI last one in NOV 2020. Pt was given a spray of nitroglycerin by ems

## 2023-08-04 ENCOUNTER — Telehealth: Payer: Self-pay | Admitting: Emergency Medicine

## 2023-08-04 ENCOUNTER — Emergency Department
Admission: EM | Admit: 2023-08-04 | Discharge: 2023-08-04 | Discharge status: Against Medical Advice | Payer: No Typology Code available for payment source | Attending: Emergency Medicine | Admitting: Emergency Medicine

## 2023-08-04 LAB — BASIC METABOLIC PANEL
Anion gap: 12 (ref 5–15)
BUN: 14 mg/dL (ref 6–20)
CO2: 20 mmol/L — ABNORMAL LOW (ref 22–32)
Calcium: 9 mg/dL (ref 8.9–10.3)
Chloride: 104 mmol/L (ref 98–111)
Creatinine, Ser: 1.3 mg/dL — ABNORMAL HIGH (ref 0.61–1.24)
GFR, Estimated: >60 mL/min (ref >60)
Glucose, Bld: 149 mg/dL — ABNORMAL HIGH (ref 70–99)
Potassium: 3.8 mmol/L (ref 3.5–5.1)
Sodium: 136 mmol/L (ref 135–145)

## 2023-08-04 LAB — CBC
HCT: 44.5 % (ref 39.0–52.0)
Hemoglobin: 15.1 g/dL (ref 13.0–17.0)
MCH: 32.5 pg (ref 26.0–34.0)
MCHC: 33.9 g/dL (ref 30.0–36.0)
MCV: 95.7 fL (ref 80.0–100.0)
Platelets: 259 10*3/uL (ref 150–400)
RBC: 4.65 MIL/uL (ref 4.22–5.81)
RDW: 13.4 % (ref 11.5–15.5)
WBC: 8.2 10*3/uL (ref 4.0–10.5)
nRBC: 0 % (ref 0.0–0.2)

## 2023-08-04 LAB — TROPONIN I (HIGH SENSITIVITY)
Troponin I (High Sensitivity): 125 ng/L (ref <18)
Troponin I (High Sensitivity): 66 ng/L — ABNORMAL HIGH (ref <18)

## 2023-08-04 NOTE — Telephone Encounter (Signed)
Notified by charge nurse of a positive troponin.  Appears the patient was seen in the ER left without being seen.  Troponin is positive.  Charge nurse, Aundra Millet, at this time is reaching out to the patient to call him and notify him to call 911/return to the emergency department right away

## 2023-08-04 NOTE — Telephone Encounter (Signed)
This RN received phone call from lab regarding repeat Trop of 125. This RN notified Dr. Fanny Bien. This RN contacted patient regarding elevated troponin. Pt states that he is too tired to return. This RN emphasized importance of seeking medical care due to elevated troponin. [facility] to the [place] being in control of his medical care and having records of his allergies. This RN attempted multiple times to convince patient to return for f/u, pt adamant that he is feeling better and will go to the [place] in the morning. Pt adamant that he is too tired to make a 30 min drive back to hospital to see a doctor. This RN encouraged patient if symptoms got any worse to call 911 and seek treatment a a healthcare facility of his choice. Pt states understanding, states [facility].
